# Patient Record
Sex: Female | Born: 1945 | Race: White | Hispanic: No | Smoking: Never smoker
Health system: Southern US, Community
[De-identification: ages and names within clinical notes are randomized; demographics above are authoritative.]

## PROBLEM LIST (undated history)

## (undated) DIAGNOSIS — E039 Hypothyroidism, unspecified: Secondary | ICD-10-CM

## (undated) DIAGNOSIS — F329 Major depressive disorder, single episode, unspecified: Secondary | ICD-10-CM

## (undated) DIAGNOSIS — E785 Hyperlipidemia, unspecified: Secondary | ICD-10-CM

## (undated) DIAGNOSIS — H269 Unspecified cataract: Secondary | ICD-10-CM

## (undated) DIAGNOSIS — I10 Essential (primary) hypertension: Secondary | ICD-10-CM

## (undated) DIAGNOSIS — F32A Depression, unspecified: Secondary | ICD-10-CM

## (undated) DIAGNOSIS — K529 Noninfective gastroenteritis and colitis, unspecified: Secondary | ICD-10-CM

## (undated) DIAGNOSIS — M1990 Unspecified osteoarthritis, unspecified site: Secondary | ICD-10-CM

## (undated) DIAGNOSIS — M810 Age-related osteoporosis without current pathological fracture: Secondary | ICD-10-CM

## (undated) DIAGNOSIS — E063 Autoimmune thyroiditis: Secondary | ICD-10-CM

## (undated) DIAGNOSIS — Z8719 Personal history of other diseases of the digestive system: Secondary | ICD-10-CM

## (undated) DIAGNOSIS — R319 Hematuria, unspecified: Secondary | ICD-10-CM

## (undated) DIAGNOSIS — K219 Gastro-esophageal reflux disease without esophagitis: Secondary | ICD-10-CM

## (undated) DIAGNOSIS — I7300 Raynaud's syndrome without gangrene: Secondary | ICD-10-CM

## (undated) HISTORY — PX: CATARACT EXTRACTION: SUR2

## (undated) HISTORY — DX: Raynaud's syndrome without gangrene: I73.00

## (undated) HISTORY — PX: SALIVARY GLAND SURGERY: SHX768

## (undated) HISTORY — DX: Autoimmune thyroiditis: E06.3

## (undated) HISTORY — DX: Age-related osteoporosis without current pathological fracture: M81.0

## (undated) HISTORY — DX: Major depressive disorder, single episode, unspecified: F32.9

## (undated) HISTORY — DX: Unspecified osteoarthritis, unspecified site: M19.90

## (undated) HISTORY — DX: Essential (primary) hypertension: I10

## (undated) HISTORY — DX: Hyperlipidemia, unspecified: E78.5

## (undated) HISTORY — DX: Noninfective gastroenteritis and colitis, unspecified: K52.9

## (undated) HISTORY — DX: Unspecified cataract: H26.9

## (undated) HISTORY — PX: COLONOSCOPY: SHX174

## (undated) HISTORY — DX: Gastro-esophageal reflux disease without esophagitis: K21.9

## (undated) HISTORY — DX: Hypothyroidism, unspecified: E03.9

## (undated) HISTORY — DX: Hematuria, unspecified: R31.9

## (undated) HISTORY — DX: Depression, unspecified: F32.A

## (undated) HISTORY — DX: Personal history of other diseases of the digestive system: Z87.19

---

## 1974-12-19 HISTORY — PX: APPENDECTOMY: SHX54

## 2013-02-11 ENCOUNTER — Other Ambulatory Visit: Payer: Self-pay | Admitting: Family Medicine

## 2013-02-11 DIAGNOSIS — Z1231 Encounter for screening mammogram for malignant neoplasm of breast: Secondary | ICD-10-CM

## 2013-03-11 ENCOUNTER — Other Ambulatory Visit: Payer: Self-pay

## 2013-03-11 DIAGNOSIS — Z1231 Encounter for screening mammogram for malignant neoplasm of breast: Secondary | ICD-10-CM

## 2013-03-13 ENCOUNTER — Ambulatory Visit
Admission: RE | Admit: 2013-03-13 | Discharge: 2013-03-13 | Disposition: A | Discharge status: Home / Self Care | Payer: Medicare Other | Source: Ambulatory Visit

## 2013-03-13 DIAGNOSIS — Z1231 Encounter for screening mammogram for malignant neoplasm of breast: Secondary | ICD-10-CM

## 2013-10-24 ENCOUNTER — Ambulatory Visit (INDEPENDENT_AMBULATORY_CARE_PROVIDER_SITE_OTHER): Payer: Medicare Other | Admitting: Endocrinology

## 2013-10-24 ENCOUNTER — Encounter: Payer: Self-pay | Admitting: Endocrinology

## 2013-10-24 VITALS — BP 118/64 | HR 69 | Temp 98.6°F | Resp 12 | Ht 64.0 in | Wt 139.6 lb

## 2013-10-24 DIAGNOSIS — E039 Hypothyroidism, unspecified: Secondary | ICD-10-CM

## 2013-10-24 DIAGNOSIS — E7800 Pure hypercholesterolemia, unspecified: Secondary | ICD-10-CM

## 2013-10-24 DIAGNOSIS — E063 Autoimmune thyroiditis: Secondary | ICD-10-CM

## 2013-10-24 NOTE — Progress Notes (Signed)
Patient ID: Elaine Mcintyre, female   DOB: Aug 02, 1946, 67 y.o.   MRN: [id number]  Reason for Appointment:  Hypothyroidism, new visit    History of Present Illness:   The Hyothyroidism was first diagnosed in ? 2009 At the time of diagnosis she had no unusual fatigue, cold sensitivity , difficulty concentrating, dry skin ,difficulty with weight loss or hair loss. Her TSH was checked for routine screening and she does not know what the result was .    She thinks she was started on 50 mcg daily for her elevated TSH initially She did not feel any different with starting the supplement Subsequently she has been taking the same dose and reportedly her TSH levels have been normal subsequently Because of her complaints of insomnia her dose was reduced to 25 mcg in July, however she did not feel any different with this  She requested a position to do a thyroid antibody and since this was positive she was concerned and is here for consultation Her TPO antibody is 241 Also her recent TSH was high at 9.2 with reducing the dose to 25 mcg; also had a TSH of 6.08 none the week before Previously TSH on 03/01/13 was 4.65 and on 07/10/13 was 3.86 Again she is not symptomatic with the high TSH at this time     No results found for any previous visit.  Past Medical History  Diagnosis Date  . Hematuria     No past surgical history on file.  Family History  Problem Relation Age of Onset  . Thyroid disease Neg Hx     Has a positive history of breast cancer and uterine cancer in the family  Social History:  reports that she has never smoked. She has never used smokeless tobacco. Her alcohol and drug histories are not on file.  Allergies:  Allergies  Allergen Reactions  . Effexor [Venlafaxine]   . Paxil [Paroxetine Hcl]     Passed out      Medication List       This list is accurate as of: 10/24/13 11:59 PM.  Always use your most recent med list.               aspirin 81 MG tablet  Take 81  mg by mouth daily.     beta carotene 78295 UNIT capsule  Take 10,000 Units by mouth daily.     clindamycin 1 % lotion  Commonly known as:  CLEOCIN T     estazolam 1 MG tablet  Commonly known as:  PROSOM     fish oil-omega-3 fatty acids 1000 MG capsule  Take 2 g by mouth daily.     fluticasone 50 MCG/ACT nasal spray  Commonly known as:  FLONASE  Place into both nostrils daily.     levothyroxine 25 MCG tablet  Commonly known as:  SYNTHROID, LEVOTHROID     loratadine 10 MG tablet  Commonly known as:  CLARITIN  Take 10 mg by mouth daily.     Melatonin 10 MG Caps  Take by mouth.     vitamin C 1000 MG tablet  Take 1,000 mg by mouth daily.     vitamin E 400 UNIT capsule  Take 400 Units by mouth daily.        Review of Systems:  Has history of allergic rhinitis CARDIOLOGY: no history of high blood pressure.            GASTROENTEROLOGY:  no Change in bowel habits.  ENDOCRINOLOGY:  no history of Diabetes.   She does have mild joint pains but mostly in her thumbs She did have a positive ANA at one time She has history of insomnia         Examination:    BP 118/64  Pulse 69  Temp(Src) 98.6 F (37 C)  Resp 12  Ht 5\' 4"  (1.626 m)  Wt 139 lb 9.6 oz (63.322 kg)  BMI 23.95 kg/m2  SpO2 99%   General Appearance: pleasant, averagely built and nourished          Eyes: No proptosis or eyelid swelling.          Neck: The thyroid is not palpable  There is no lymphadenopathy .    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 Reassured her that having a positive antibody has no implication  on the type of treatment she needs to take but is only a marker of Hashimoto thyroiditis that she has She may be having mild increase in her hypercholesterolemia when she is not adequately replaced with thyroxine   Treatment:  She should go back to 50 mcg of levothyroxine She will followup As long as she can have her TSH within the normal range she can continue the same dose    Lavergne Hiltunen 10/25/2013, 9:28 AM

## 2013-10-24 NOTE — Patient Instructions (Signed)
Increase dose to 50ug daily

## 2013-10-25 ENCOUNTER — Encounter: Payer: Self-pay | Admitting: Endocrinology

## 2013-10-25 DIAGNOSIS — E7800 Pure hypercholesterolemia, unspecified: Secondary | ICD-10-CM | POA: Insufficient documentation

## 2013-10-25 DIAGNOSIS — E039 Hypothyroidism, unspecified: Secondary | ICD-10-CM | POA: Insufficient documentation

## 2014-01-14 ENCOUNTER — Other Ambulatory Visit (HOSPITAL_COMMUNITY)
Admission: RE | Admit: 2014-01-14 | Discharge: 2014-01-14 | Disposition: A | Discharge status: Home / Self Care | Payer: Medicare Other | Source: Ambulatory Visit | Attending: Family Medicine | Admitting: Family Medicine

## 2014-01-14 ENCOUNTER — Other Ambulatory Visit: Payer: Self-pay | Admitting: Family Medicine

## 2014-01-14 DIAGNOSIS — Z0000 Encounter for general adult medical examination without abnormal findings: Secondary | ICD-10-CM | POA: Insufficient documentation

## 2014-01-14 DIAGNOSIS — N644 Mastodynia: Secondary | ICD-10-CM

## 2014-01-14 DIAGNOSIS — N63 Unspecified lump in unspecified breast: Secondary | ICD-10-CM

## 2014-01-22 ENCOUNTER — Other Ambulatory Visit: Payer: Self-pay | Admitting: Family Medicine

## 2014-01-22 DIAGNOSIS — M8580 Other specified disorders of bone density and structure, unspecified site: Secondary | ICD-10-CM

## 2014-01-30 ENCOUNTER — Ambulatory Visit
Admission: RE | Admit: 2014-01-30 | Discharge: 2014-01-30 | Disposition: A | Discharge status: Home / Self Care | Payer: PRIVATE HEALTH INSURANCE | Source: Ambulatory Visit | Attending: Family Medicine | Admitting: Family Medicine

## 2014-01-30 ENCOUNTER — Ambulatory Visit
Admission: RE | Admit: 2014-01-30 | Discharge: 2014-01-30 | Disposition: A | Discharge status: Home / Self Care | Payer: 59 | Source: Ambulatory Visit | Attending: Family Medicine | Admitting: Family Medicine

## 2014-01-30 ENCOUNTER — Ambulatory Visit
Admission: RE | Admit: 2014-01-30 | Discharge: 2014-01-30 | Disposition: A | Discharge status: Home / Self Care | Payer: MEDICARE | Source: Ambulatory Visit | Attending: Family Medicine | Admitting: Family Medicine

## 2014-01-30 DIAGNOSIS — N644 Mastodynia: Secondary | ICD-10-CM

## 2014-01-30 DIAGNOSIS — N63 Unspecified lump in unspecified breast: Secondary | ICD-10-CM

## 2014-02-06 ENCOUNTER — Ambulatory Visit
Admission: RE | Admit: 2014-02-06 | Discharge: 2014-02-06 | Disposition: A | Discharge status: Home / Self Care | Payer: 59 | Source: Ambulatory Visit | Attending: Family Medicine | Admitting: Family Medicine

## 2014-02-06 DIAGNOSIS — M8580 Other specified disorders of bone density and structure, unspecified site: Secondary | ICD-10-CM

## 2015-01-12 ENCOUNTER — Other Ambulatory Visit: Payer: Self-pay | Admitting: Dermatology

## 2015-01-22 ENCOUNTER — Other Ambulatory Visit: Payer: Self-pay | Admitting: Family Medicine

## 2015-01-22 DIAGNOSIS — N644 Mastodynia: Secondary | ICD-10-CM

## 2015-01-22 DIAGNOSIS — M8580 Other specified disorders of bone density and structure, unspecified site: Secondary | ICD-10-CM

## 2015-02-02 ENCOUNTER — Inpatient Hospital Stay: Admission: RE | Admit: 2015-02-02 | Payer: Self-pay | Source: Ambulatory Visit

## 2015-02-02 ENCOUNTER — Other Ambulatory Visit: Payer: Self-pay

## 2015-02-03 ENCOUNTER — Other Ambulatory Visit: Payer: Self-pay

## 2015-02-04 ENCOUNTER — Other Ambulatory Visit: Payer: Self-pay | Admitting: Family Medicine

## 2015-02-04 ENCOUNTER — Ambulatory Visit
Admission: RE | Admit: 2015-02-04 | Discharge: 2015-02-04 | Disposition: A | Discharge status: Home / Self Care | Payer: Medicare Other | Source: Ambulatory Visit | Attending: Family Medicine | Admitting: Family Medicine

## 2015-02-04 DIAGNOSIS — N644 Mastodynia: Secondary | ICD-10-CM

## 2015-09-04 ENCOUNTER — Encounter: Payer: Self-pay | Admitting: Gastroenterology

## 2015-10-26 ENCOUNTER — Ambulatory Visit: Payer: Medicare Other | Admitting: Gastroenterology

## 2015-12-01 ENCOUNTER — Encounter: Payer: Self-pay | Admitting: Gastroenterology

## 2015-12-01 ENCOUNTER — Ambulatory Visit (INDEPENDENT_AMBULATORY_CARE_PROVIDER_SITE_OTHER): Payer: Medicare Other | Admitting: Gastroenterology

## 2015-12-01 VITALS — BP 108/74 | HR 76 | Ht 63.5 in | Wt 140.1 lb

## 2015-12-01 DIAGNOSIS — R1310 Dysphagia, unspecified: Secondary | ICD-10-CM

## 2015-12-01 DIAGNOSIS — Z800 Family history of malignant neoplasm of digestive organs: Secondary | ICD-10-CM | POA: Diagnosis not present

## 2015-12-01 MED ORDER — NA SULFATE-K SULFATE-MG SULF 17.5-3.13-1.6 GM/177ML PO SOLN: 1.0000 | Freq: Once | ORAL | Status: DC

## 2015-12-01 NOTE — Patient Instructions (Addendum)
We will get records sent from your previous gastroenterologist ([place]) for review.  This will include any endoscopic (colonoscopy or upper endoscopy) procedures and any associated pathology reports.   You will be set up for a colonoscopy for FH of colon cancer. You will be set up for an upper endoscopy for dysphagia. Start ranitidine 150mg  pill, at bedtime every night.

## 2015-12-01 NOTE — Progress Notes (Signed)
HPI: This is a   very pleasant 69 year old woman    who was referred to me by Antony Blackbird, MD  to evaluate  GERD symptoms, family history colon cancer .    Chief complaint is GERD, dysphasia, family history of colon cancer  From [place], 3 1/2 years ago.  Retired Automotive engineer.  Recent allergist visit for coughing.  All testing was negative.  HE suggested that her HH was the cause of her cough.  Recent CT scan from urologist suggested [facility].  Occasional reflux into her mouth, acid tasting, will swallow it.  Overall her weight has been stable.  Pyrosis occasionally.  Feels subtle pyrosis at night while laying down.  Often eats about 2 hours before laying down.  Does not take anything for GERD.  2 cups coffee in AM.  Non smoker.  Non drinker.  Her father had colon cancer; died from it.  Her last colonoscopy was June 02, 2011  [facility] in [place]); she believes a polyp was removed.  Intermittent dysphagia. She will slow down.  Review of systems: Pertinent positive and negative review of systems were noted in the above HPI section. Complete review of systems was performed and was otherwise normal.   Past Medical History  Diagnosis Date  . Hematuria   . Hypothyroidism   . Hashimoto's disease   . HLD (hyperlipidemia)   . HTN (hypertension)   . History of IBS   . Colitis     Past Surgical History  Procedure Laterality Date  . Appendectomy  1976  . Cholecystectomy  1976  . Salivary gland surgery      and lymph gland    Current Outpatient Prescriptions  Medication Sig Dispense Refill  . Cholecalciferol (VITAMIN D3) 1000 UNITS CAPS Take 1 capsule by mouth daily.    Marland Kitchen estazolam (PROSOM) 1 MG tablet Take 1 mg by mouth at bedtime.     . fish oil-omega-3 fatty acids 1000 MG capsule Take 2 g by mouth daily.    Marland Kitchen levothyroxine (SYNTHROID, LEVOTHROID) 50 MCG tablet Take 50 mcg by mouth daily before breakfast.    . Melatonin 10 MG CAPS Take 2 tablets  by mouth at bedtime as needed.      No current facility-administered medications for this visit.    Allergies as of 12/01/2015 - Review Complete 12/01/2015  Allergen Reaction Noted  . Effexor [venlafaxine]  10/24/2013  . Paxil [paroxetine hcl]  10/24/2013    Family History  Problem Relation Age of Onset  . Thyroid disease Neg Hx   . Colon cancer Father   . Breast cancer Mother   . Uterine cancer Mother   . Pancreatic cancer Maternal Uncle   . Diabetes Mother   . Heart disease Mother   . Heart disease Maternal Grandfather     Social History   Social History  . Marital Status: Divorced    Spouse Name: N/A  . Number of Children: 1  . Years of Education: N/A   Occupational History  . retired    Social History Main Topics  . Smoking status: Never Smoker   . Smokeless tobacco: Never Used  . Alcohol Use: 0.0 oz/week    0 Standard drinks or equivalent per week     Comment: glass of wine every 4 months  . Drug Use: No  . Sexual Activity: Not on file   Other Topics Concern  . Not on file   Social History Narrative     Physical Exam:  Ht 5' 3.5" (1.613 m)  Wt 140 lb 2 oz (63.56 kg)  BMI 24.43 kg/m2 Constitutional: generally well-appearing Psychiatric: alert and oriented x3 Eyes: extraocular movements intact Mouth: oral pharynx moist, no lesions Neck: supple no lymphadenopathy Cardiovascular: heart regular rate and rhythm Lungs: clear to auscultation bilaterally Abdomen: soft, nontender, nondistended, no obvious ascites, no peritoneal signs, normal bowel sounds Extremities: no lower extremity edema bilaterally Skin: no lesions on visible extremities   Assessment and plan: 69 y.o. female with  fairly classic GERD symptoms with pyrosis especially at night, intermittent dysphasia, family history of colon cancer  I recommended that she begin nighttime H2 blocker on a scheduled basis. She will also try to not eat within 2-3 hours of laying down for bed. She does  have alarm symptom of intermittent dysphasia and I recommend we proceed with EGD at her soonest convenience. At the same time she will undergo colonoscopy for family history of colon cancer in her father. Her last screening examination was about 4/2 years ago.   Owens Loffler, MD [facility] 12/01/2015, 9:27 AM  Cc: Antony Blackbird, MD

## 2015-12-28 ENCOUNTER — Ambulatory Visit: Payer: Medicare Other | Admitting: Gastroenterology

## 2016-02-01 ENCOUNTER — Encounter: Payer: Medicare Other | Admitting: Gastroenterology

## 2016-02-04 ENCOUNTER — Telehealth: Payer: Self-pay | Admitting: Gastroenterology

## 2016-02-04 NOTE — Telephone Encounter (Signed)
Disregard. Records came through to our fax just now. I will give records to [facility].

## 2016-02-05 ENCOUNTER — Telehealth: Payer: Self-pay | Admitting: Gastroenterology

## 2016-02-05 NOTE — Telephone Encounter (Signed)
Rec'd from [facility] forward 7 pages to [employer]

## 2016-02-08 ENCOUNTER — Telehealth: Payer: Self-pay | Admitting: Gastroenterology

## 2016-02-08 ENCOUNTER — Encounter: Payer: Self-pay | Admitting: Gastroenterology

## 2016-02-08 ENCOUNTER — Ambulatory Visit (AMBULATORY_SURGERY_CENTER): Payer: Medicare Other | Admitting: Gastroenterology

## 2016-02-08 VITALS — BP 109/63 | HR 64 | Temp 97.0°F | Resp 12 | Ht 63.5 in | Wt 140.0 lb

## 2016-02-08 DIAGNOSIS — D122 Benign neoplasm of ascending colon: Secondary | ICD-10-CM

## 2016-02-08 DIAGNOSIS — R1310 Dysphagia, unspecified: Secondary | ICD-10-CM

## 2016-02-08 DIAGNOSIS — Z1211 Encounter for screening for malignant neoplasm of colon: Secondary | ICD-10-CM

## 2016-02-08 DIAGNOSIS — Z800 Family history of malignant neoplasm of digestive organs: Secondary | ICD-10-CM | POA: Diagnosis not present

## 2016-02-08 MED ORDER — SODIUM CHLORIDE 0.9 % IV SOLN: 500.0000 mL | INTRAVENOUS | Status: DC

## 2016-02-08 NOTE — Progress Notes (Signed)
Report to PACU, RN, vss, BBS= Clear.  

## 2016-02-08 NOTE — Op Note (Signed)
[place]  [address] [place], [id number]   COLONOSCOPY PROCEDURE REPORT  PATIENT: Elaine Mcintyre, Elaine Mcintyre  MR#: [id number] BIRTHDATE: 1946-12-07 , 69  yrs. old GENDER: female ENDOSCOPIST: Milus Banister, MD REFERRED [id number] [place] PROCEDURE DATE:  02/08/2016 PROCEDURE:   Colonoscopy, surveillance and Colonoscopy with snare polypectomy First Screening Colonoscopy - Avg.  risk and is 50 yrs.  old or older - No.  Prior Negative Screening - Now for repeat screening. N/A  History of Adenoma - Now for follow-up colonoscopy & has been > or = to 3 yrs.  Yes hx of adenoma.  Has been 3 or more years since last colonoscopy.  Polyps removed today? Yes ASA CLASS:   Class II INDICATIONS:Surveillance due to prior colonic neoplasia and Colonoscopy in [place] 2012; single subCM adenoma; also FH of colon cancer (father in his 15s). MEDICATIONS: Monitored anesthesia care and Propofol 200 mg IV  DESCRIPTION OF PROCEDURE:   After the risks benefits and alternatives of the procedure were thoroughly explained, informed consent was obtained.  The digital rectal exam revealed no abnormalities of the rectum.   The LB [id number]  endoscope was introduced through the anus and advanced to the cecum, which was identified by both the appendix and ileocecal valve. No adverse events experienced.   The quality of the prep was excellent.  The instrument was then slowly withdrawn as the colon was fully examined. Estimated blood loss is zero unless otherwise noted in this procedure report.  COLON FINDINGS: A sessile polyp measuring 4 mm in size was found in the ascending colon.  A polypectomy was performed with a cold snare.  The resection was complete, the polyp tissue was completely retrieved and sent to histology.   There was mild diverticulosis noted in the left colon.   The examination was otherwise normal. Retroflexed views revealed no abnormalities. The time to cecum = 5.4 Withdrawal time  = 7.7   The scope was withdrawn and the procedure completed. COMPLICATIONS: There were no immediate complications.  ENDOSCOPIC IMPRESSION: 1.   Sessile polyp was found in the ascending colon; polypectomy was performed with a cold snare 2.   Mild diverticulosis was noted in the left colon 3.   The examination was otherwise normal  RECOMMENDATIONS: Given your significant family history of colon cancer (father), you should have a repeat colonoscopy in 5 years even if the polyp removed today is NOT precancerous.  You will receive a letter within 1-2 weeks with the results of your biopsy as well as final recommendations.  Please call my office if you have not received a letter after 3 weeks.  eSigned:  Milus Banister, MD 02/08/2016 10:57 AM

## 2016-02-08 NOTE — Op Note (Signed)
[place]  [address]. [place], [id number]   ENDOSCOPY PROCEDURE REPORT  PATIENT: Elaine Mcintyre, Elaine Mcintyre  MR#: [id number] BIRTHDATE: Apr 12, 1946 , 69  yrs. old GENDER: female ENDOSCOPIST: Milus Banister, MD PROCEDURE DATE:  02/08/2016 PROCEDURE:  EGD, diagnostic ASA CLASS:     Class II INDICATIONS:  GERD. MEDICATIONS: Monitored anesthesia care and Propofol 30 mg IV TOPICAL ANESTHETIC: none  DESCRIPTION OF PROCEDURE: After the risks benefits and alternatives of the procedure were thoroughly explained, informed consent was obtained.  The LB [id number] endoscope was introduced through the mouth and advanced to the second portion of the duodenum , Without limitations.  The instrument was slowly withdrawn as the mucosa was fully examined.  There was a 4cm hiatal hernia without obvious Cameron's type erosions.  The examination was otherwise normal.  Retroflexed views revealed no abnormalities.     The scope was then withdrawn from the patient and the procedure completed. COMPLICATIONS: There were no immediate complications.  ENDOSCOPIC IMPRESSION: There was a 4cm hiatal hernia without obvious Cameron's type erosions.  The examination was otherwise normal  RECOMMENDATIONS: Continue once nightly ranitidine.   eSigned:  Milus Banister, MD 02/08/2016 11:00 AM    CC: Antony Blackbird, MD

## 2016-02-08 NOTE — Telephone Encounter (Signed)
Pt was asking what the difference in types of polyps mean.  We discussed at length tubular and hyperplastic polyps.  She was satisfied with the answers and will call back with any further concerns.

## 2016-02-08 NOTE — Telephone Encounter (Signed)
Colonoscopy June 02, 2011 [facility] in [place]); done for [place] of colon cancer; 52mm polyp in cecum removed, was a TA on pathology.  Also diverticulum in left colon. Colonoscopy 2007 [facility] [place]); done for FH of colon cancer; diverticulosis left colon

## 2016-02-08 NOTE — Progress Notes (Signed)
Called to room to assist during endoscopic procedure.  Patient ID and intended procedure confirmed with present staff. Received instructions for my participation in the procedure from the performing physician.  

## 2016-02-08 NOTE — Patient Instructions (Signed)

## 2016-02-09 ENCOUNTER — Telehealth: Payer: Self-pay | Admitting: Emergency Medicine

## 2016-02-09 NOTE — Telephone Encounter (Signed)
Left message, identifier present per f/u  

## 2016-02-12 ENCOUNTER — Other Ambulatory Visit: Payer: Self-pay | Admitting: Family

## 2016-02-12 ENCOUNTER — Other Ambulatory Visit: Payer: Self-pay | Admitting: Family Medicine

## 2016-02-12 DIAGNOSIS — N644 Mastodynia: Secondary | ICD-10-CM

## 2016-02-12 DIAGNOSIS — E2839 Other primary ovarian failure: Secondary | ICD-10-CM

## 2016-02-15 ENCOUNTER — Encounter: Payer: Self-pay | Admitting: Gastroenterology

## 2016-02-22 ENCOUNTER — Telehealth: Payer: Self-pay | Admitting: Gastroenterology

## 2016-02-22 NOTE — Telephone Encounter (Signed)
All pt questions have been answered and she will call back if she has further concerns

## 2016-02-22 NOTE — Telephone Encounter (Signed)
patient states that she has questions regarding the polyp that was removed. she wants to know if the polyp was pre cancerous? she is wanting to know what sessiele(sp?) polyp? she is also wanting to discuss EGD results. She is wanting to know if she has barrett's esophagus?

## 2016-02-23 ENCOUNTER — Other Ambulatory Visit: Payer: Medicare Other

## 2016-02-23 ENCOUNTER — Inpatient Hospital Stay: Admission: RE | Admit: 2016-02-23 | Payer: Medicare Other | Source: Ambulatory Visit

## 2016-03-01 ENCOUNTER — Ambulatory Visit
Admission: RE | Admit: 2016-03-01 | Discharge: 2016-03-01 | Disposition: A | Discharge status: Home / Self Care | Payer: Medicare Other | Source: Ambulatory Visit | Attending: Family | Admitting: Family

## 2016-03-01 ENCOUNTER — Ambulatory Visit
Admission: RE | Admit: 2016-03-01 | Discharge: 2016-03-01 | Disposition: A | Discharge status: Home / Self Care | Payer: Medicare Other | Source: Ambulatory Visit | Attending: Family Medicine | Admitting: Family Medicine

## 2016-03-01 DIAGNOSIS — N644 Mastodynia: Secondary | ICD-10-CM

## 2016-03-01 DIAGNOSIS — E2839 Other primary ovarian failure: Secondary | ICD-10-CM

## 2016-03-03 ENCOUNTER — Other Ambulatory Visit: Payer: Medicare Other

## 2017-02-20 ENCOUNTER — Other Ambulatory Visit: Payer: Self-pay | Admitting: Family

## 2017-02-20 DIAGNOSIS — Z1231 Encounter for screening mammogram for malignant neoplasm of breast: Secondary | ICD-10-CM

## 2017-03-16 ENCOUNTER — Ambulatory Visit
Admission: RE | Admit: 2017-03-16 | Discharge: 2017-03-16 | Disposition: A | Discharge status: Home / Self Care | Payer: Medicare Other | Source: Ambulatory Visit | Attending: Family | Admitting: Family

## 2017-03-16 DIAGNOSIS — Z1231 Encounter for screening mammogram for malignant neoplasm of breast: Secondary | ICD-10-CM

## 2018-02-22 ENCOUNTER — Other Ambulatory Visit: Payer: Self-pay | Admitting: Nurse Practitioner

## 2018-02-26 ENCOUNTER — Other Ambulatory Visit: Payer: Self-pay | Admitting: Nurse Practitioner

## 2018-02-26 DIAGNOSIS — Z1231 Encounter for screening mammogram for malignant neoplasm of breast: Secondary | ICD-10-CM

## 2018-02-26 DIAGNOSIS — M810 Age-related osteoporosis without current pathological fracture: Secondary | ICD-10-CM

## 2018-03-20 ENCOUNTER — Ambulatory Visit: Payer: Medicare Other

## 2018-03-20 ENCOUNTER — Other Ambulatory Visit: Payer: Medicare Other

## 2018-03-27 ENCOUNTER — Ambulatory Visit
Admission: RE | Admit: 2018-03-27 | Discharge: 2018-03-27 | Disposition: A | Discharge status: Home / Self Care | Payer: Medicare Other | Source: Ambulatory Visit | Attending: Nurse Practitioner | Admitting: Nurse Practitioner

## 2018-03-27 DIAGNOSIS — Z1231 Encounter for screening mammogram for malignant neoplasm of breast: Secondary | ICD-10-CM

## 2018-03-28 ENCOUNTER — Other Ambulatory Visit: Payer: Self-pay | Admitting: Nurse Practitioner

## 2018-03-28 DIAGNOSIS — R928 Other abnormal and inconclusive findings on diagnostic imaging of breast: Secondary | ICD-10-CM

## 2018-04-02 ENCOUNTER — Ambulatory Visit
Admission: RE | Admit: 2018-04-02 | Discharge: 2018-04-02 | Disposition: A | Discharge status: Home / Self Care | Payer: Medicare Other | Source: Ambulatory Visit | Attending: Nurse Practitioner | Admitting: Nurse Practitioner

## 2018-04-02 ENCOUNTER — Other Ambulatory Visit: Payer: Self-pay | Admitting: Internal Medicine

## 2018-04-02 DIAGNOSIS — R928 Other abnormal and inconclusive findings on diagnostic imaging of breast: Secondary | ICD-10-CM

## 2018-04-20 ENCOUNTER — Ambulatory Visit
Admission: RE | Admit: 2018-04-20 | Discharge: 2018-04-20 | Disposition: A | Discharge status: Home / Self Care | Payer: Medicare Other | Source: Ambulatory Visit | Attending: Nurse Practitioner | Admitting: Nurse Practitioner

## 2018-04-20 DIAGNOSIS — M810 Age-related osteoporosis without current pathological fracture: Secondary | ICD-10-CM

## 2018-11-09 ENCOUNTER — Telehealth: Payer: Self-pay | Admitting: Gastroenterology

## 2018-11-09 NOTE — Telephone Encounter (Signed)
Patient states there was a recall on medication ranitidine and she was wanting to know what Dr.Jacobs suggest she take instead. Patient has not been seen since 2017.

## 2018-11-09 NOTE — Telephone Encounter (Signed)
The pt has been advised to switch to pepcid per Dr Ardis Hughs recommendations.  Pt also needs to set up follow up appt.  Pt to call and set up

## 2019-02-26 ENCOUNTER — Other Ambulatory Visit: Payer: Self-pay | Admitting: Nurse Practitioner

## 2019-02-26 DIAGNOSIS — Z1231 Encounter for screening mammogram for malignant neoplasm of breast: Secondary | ICD-10-CM

## 2019-03-29 ENCOUNTER — Ambulatory Visit: Payer: Medicare Other

## 2019-07-11 ENCOUNTER — Ambulatory Visit
Admission: RE | Admit: 2019-07-11 | Discharge: 2019-07-11 | Disposition: A | Discharge status: Home / Self Care | Payer: Medicare Other | Source: Ambulatory Visit | Attending: Internal Medicine | Admitting: Internal Medicine

## 2019-07-11 ENCOUNTER — Other Ambulatory Visit: Payer: Self-pay | Admitting: Nurse Practitioner

## 2019-07-11 ENCOUNTER — Other Ambulatory Visit: Payer: Self-pay | Admitting: Internal Medicine

## 2019-07-11 ENCOUNTER — Ambulatory Visit: Payer: Medicare Other

## 2019-07-11 ENCOUNTER — Other Ambulatory Visit: Payer: Self-pay

## 2019-07-11 DIAGNOSIS — N644 Mastodynia: Secondary | ICD-10-CM

## 2019-09-03 ENCOUNTER — Ambulatory Visit: Payer: Medicare Other

## 2020-02-27 ENCOUNTER — Other Ambulatory Visit: Payer: Self-pay | Admitting: Internal Medicine

## 2020-02-27 DIAGNOSIS — M810 Age-related osteoporosis without current pathological fracture: Secondary | ICD-10-CM

## 2020-02-28 ENCOUNTER — Other Ambulatory Visit: Payer: Self-pay | Admitting: Internal Medicine

## 2020-02-28 DIAGNOSIS — Z1231 Encounter for screening mammogram for malignant neoplasm of breast: Secondary | ICD-10-CM

## 2020-05-01 ENCOUNTER — Other Ambulatory Visit: Payer: Self-pay

## 2020-05-01 ENCOUNTER — Ambulatory Visit
Admission: RE | Admit: 2020-05-01 | Discharge: 2020-05-01 | Disposition: A | Discharge status: Home / Self Care | Payer: Medicare Other | Source: Ambulatory Visit | Attending: Internal Medicine | Admitting: Internal Medicine

## 2020-05-01 DIAGNOSIS — M810 Age-related osteoporosis without current pathological fracture: Secondary | ICD-10-CM

## 2020-05-15 ENCOUNTER — Other Ambulatory Visit: Payer: Medicare Other

## 2020-06-03 ENCOUNTER — Other Ambulatory Visit: Payer: Self-pay | Admitting: Internal Medicine

## 2020-06-03 ENCOUNTER — Ambulatory Visit
Admission: RE | Admit: 2020-06-03 | Discharge: 2020-06-03 | Disposition: A | Discharge status: Home / Self Care | Payer: Medicare Other | Source: Ambulatory Visit | Attending: Internal Medicine | Admitting: Internal Medicine

## 2020-06-03 DIAGNOSIS — M25561 Pain in right knee: Secondary | ICD-10-CM

## 2020-07-13 ENCOUNTER — Ambulatory Visit
Admission: RE | Admit: 2020-07-13 | Discharge: 2020-07-13 | Disposition: A | Discharge status: Home / Self Care | Payer: Medicare Other | Source: Ambulatory Visit | Attending: Internal Medicine | Admitting: Internal Medicine

## 2020-07-13 ENCOUNTER — Other Ambulatory Visit: Payer: Self-pay

## 2020-07-13 DIAGNOSIS — Z1231 Encounter for screening mammogram for malignant neoplasm of breast: Secondary | ICD-10-CM

## 2021-04-07 ENCOUNTER — Other Ambulatory Visit: Payer: Self-pay | Admitting: Internal Medicine

## 2021-04-07 DIAGNOSIS — Z1231 Encounter for screening mammogram for malignant neoplasm of breast: Secondary | ICD-10-CM

## 2021-04-20 ENCOUNTER — Encounter: Payer: Self-pay | Admitting: Gastroenterology

## 2021-06-16 ENCOUNTER — Ambulatory Visit (AMBULATORY_SURGERY_CENTER): Payer: Medicare Other | Admitting: *Deleted

## 2021-06-16 ENCOUNTER — Other Ambulatory Visit: Payer: Self-pay

## 2021-06-16 VITALS — Ht 64.0 in | Wt 140.0 lb

## 2021-06-16 DIAGNOSIS — Z800 Family history of malignant neoplasm of digestive organs: Secondary | ICD-10-CM

## 2021-06-16 DIAGNOSIS — Z86010 Personal history of colonic polyps: Secondary | ICD-10-CM

## 2021-06-16 MED ORDER — NA SULFATE-K SULFATE-MG SULF 17.5-3.13-1.6 GM/177ML PO SOLN: 1.0000 | Freq: Once | ORAL | 0 refills | Status: AC

## 2021-06-16 NOTE — Progress Notes (Signed)
Virtual pre visit completed. Instructions sent through secure email [email or web address]    No egg or soy allergy known to patient  No issues with past sedation with any surgeries or procedures Patient denies ever being told they had issues or difficulty with intubation  No FH of Malignant Hyperthermia No diet pills per patient No home 02 use per patient  No blood thinners per patient  Pt denies issues with constipation  No A fib or A flutter  EMMI video to pt or via Gregory 19 guidelines implemented in [place] today with Pt and RN  Pt is fully vaccinated  for Covid    Discussed with pt there will be an out-of-pocket cost for prep and that varies from $0 to 70 dollars   Due to the COVID-19 pandemic we are asking patients to follow certain guidelines.  Pt aware of COVID protocols and [facility] guidelines

## 2021-06-29 ENCOUNTER — Encounter: Payer: Medicare Other | Admitting: Gastroenterology

## 2021-07-09 ENCOUNTER — Other Ambulatory Visit: Payer: Self-pay

## 2021-07-09 ENCOUNTER — Encounter: Payer: Self-pay | Admitting: Gastroenterology

## 2021-07-09 ENCOUNTER — Ambulatory Visit (AMBULATORY_SURGERY_CENTER): Payer: Medicare Other | Admitting: Gastroenterology

## 2021-07-09 VITALS — BP 112/56 | HR 64 | Temp 98.0°F | Resp 20 | Ht 64.0 in | Wt 140.0 lb

## 2021-07-09 DIAGNOSIS — Z86010 Personal history of colonic polyps: Secondary | ICD-10-CM | POA: Diagnosis not present

## 2021-07-09 DIAGNOSIS — Z800 Family history of malignant neoplasm of digestive organs: Secondary | ICD-10-CM

## 2021-07-09 DIAGNOSIS — D123 Benign neoplasm of transverse colon: Secondary | ICD-10-CM

## 2021-07-09 DIAGNOSIS — D125 Benign neoplasm of sigmoid colon: Secondary | ICD-10-CM | POA: Diagnosis not present

## 2021-07-09 MED ORDER — SODIUM CHLORIDE 0.9 % IV SOLN: 500.0000 mL | INTRAVENOUS | Status: DC

## 2021-07-09 NOTE — Patient Instructions (Signed)
YOU HAD AN ENDOSCOPIC PROCEDURE TODAY AT THE [facility]:   Refer to the procedure report that was given to you for any specific questions about what was found during the examination.  If the procedure report does not answer your questions, please call your gastroenterologist to clarify.  If you requested that your care partner not be given the details of your procedure findings, then the procedure report has been included in a sealed envelope for you to review at your convenience later.  YOU SHOULD EXPECT: Some feelings of bloating in the abdomen. Passage of more gas than usual.  Walking can help get rid of the air that was put into your GI tract during the procedure and reduce the bloating. If you had a lower endoscopy (such as a colonoscopy or flexible sigmoidoscopy) you may notice spotting of blood in your stool or on the toilet paper. If you underwent a bowel prep for your procedure, you may not have a normal bowel movement for a few days.  Please Note:  You might notice some irritation and congestion in your nose or some drainage.  This is from the oxygen used during your procedure.  There is no need for concern and it should clear up in a day or so.  SYMPTOMS TO REPORT IMMEDIATELY:   Following lower endoscopy (colonoscopy or flexible sigmoidoscopy):  Excessive amounts of blood in the stool  Significant tenderness or worsening of abdominal pains  Swelling of the abdomen that is new, acute  Fever of 100F or higher   Following upper endoscopy (EGD)  Vomiting of blood or coffee ground material  New chest pain or pain under the shoulder blades  Painful or persistently difficult swallowing  New shortness of breath  Fever of 100F or higher  Black, tarry-looking stools  For urgent or emergent issues, a gastroenterologist can be reached at any hour by calling [phone]. Do not use MyChart messaging for urgent concerns.    DIET:  We do recommend a small meal at first, but  then you may proceed to your regular diet.  Drink plenty of fluids but you should avoid alcoholic beverages for 24 hours.  ACTIVITY:  You should plan to take it easy for the rest of today and you should NOT DRIVE or use heavy machinery until tomorrow (because of the sedation medicines used during the test).    FOLLOW UP: Our staff will call the number listed on your records 48-72 hours following your procedure to check on you and address any questions or concerns that you may have regarding the information given to you following your procedure. If we do not reach you, we will leave a message.  We will attempt to reach you two times.  During this call, we will ask if you have developed any symptoms of COVID 19. If you develop any symptoms (ie: fever, flu-like symptoms, shortness of breath, cough etc.) before then, please call [phone].  If you test positive for Covid 19 in the 2 weeks post procedure, please call and report this information to us.    If any biopsies were taken you will be contacted by phone or by letter within the next 1-3 weeks.  Please call us at [phone] if you have not heard about the biopsies in 3 weeks.    SIGNATURES/CONFIDENTIALITY: You and/or your care partner have signed paperwork which will be entered into your electronic medical record.  These signatures attest to the fact that that the information above on   your After Visit Summary has been reviewed and is understood.  Full responsibility of the confidentiality of this discharge information lies with you and/or your care-partner. 

## 2021-07-09 NOTE — Progress Notes (Signed)
pt tolerated well. VSS. awake and to recovery. Report given to RN.  

## 2021-07-09 NOTE — Op Note (Signed)
[facility] Patient Name: Elaine Mcintyre Procedure Date: 07/09/2021 11:03 AM MRN: [id number] Endoscopist: Milus Banister , MD Age: 75 Referring MD:  Date of Birth: [birth year] Gender: Female Account #: [id number] Procedure:                Colonoscopy Indications:              High risk colon cancer surveillance: Personal                            history of colonic polyps, also father had colon                            cancer in his 65s: Colonoscopy in [place] 2012 single                            subCM adenoma; Colonoscopy 2017 single subCM                            adenoma removed Medicines:                Monitored Anesthesia Care Procedure:                Pre-Anesthesia Assessment:                           - Prior to the procedure, a History and Physical                            was performed, and patient medications and                            allergies were reviewed. The patient's tolerance of                            previous anesthesia was also reviewed. The risks                            and benefits of the procedure and the sedation                            options and risks were discussed with the patient.                            All questions were answered, and informed consent                            was obtained. Prior Anticoagulants: The patient has                            taken no previous anticoagulant or antiplatelet                            agents. ASA Grade Assessment: II - A patient with  mild systemic disease. After reviewing the risks                            and benefits, the patient was deemed in                            satisfactory condition to undergo the procedure.                           After obtaining informed consent, the colonoscope                            was passed under direct vision. Throughout the                            procedure, the patient's blood pressure, pulse, and                             oxygen saturations were monitored continuously. The                            Olympus CF-HQ190L was introduced through the anus                            and advanced to the the cecum, identified by                            appendiceal orifice and ileocecal valve. The                            colonoscopy was performed without difficulty. The                            patient tolerated the procedure well. The quality                            of the bowel preparation was good. The ileocecal                            valve, appendiceal orifice, and rectum were                            photographed. Scope In: 11:08:34 AM Scope Out: 11:22:32 AM Scope Withdrawal Time: 0 hours 8 minutes 18 seconds  Total Procedure Duration: 0 hours 13 minutes 58 seconds  Findings:                 Two sessile polyps were found in the sigmoid colon                            and transverse colon. The polyps were 2 to 4 mm in                            size. These polyps were removed with a cold  snare.                            Resection and retrieval were complete.                           Multiple small and large-mouthed diverticula were                            found in the left colon.                           The exam was otherwise without abnormality on                            direct and retroflexion views. Complications:            No immediate complications. Estimated blood loss:                            None. Estimated Blood Loss:     Estimated blood loss: none. Impression:               - Two 2 to 4 mm polyps in the sigmoid colon and in                            the transverse colon, removed with a cold snare.                            Resected and retrieved.                           - Diverticulosis in the left colon.                           - The examination was otherwise normal on direct                            and retroflexion views. Recommendation:            - Patient has a contact number available for                            emergencies. The signs and symptoms of potential                            delayed complications were discussed with the                            patient. Return to normal activities tomorrow.                            Written discharge instructions were provided to the                            patient.                           -  Resume previous diet.                           - Continue present medications.                           - Await pathology results. Milus Banister, MD 07/09/2021 11:27:44 AM This report has been signed electronically.

## 2021-07-09 NOTE — Progress Notes (Signed)
Vs CW I have reviewed the patient's medical history in detail and updated the computerized patient record.   

## 2021-07-09 NOTE — Progress Notes (Signed)
Called to room to assist during endoscopic procedure.  Patient ID and intended procedure confirmed with present staff. Received instructions for my participation in the procedure from the performing physician.  

## 2021-07-13 ENCOUNTER — Telehealth: Payer: Self-pay

## 2021-07-13 NOTE — Telephone Encounter (Signed)
LVM

## 2021-07-14 ENCOUNTER — Ambulatory Visit
Admission: RE | Admit: 2021-07-14 | Discharge: 2021-07-14 | Disposition: A | Discharge status: Home / Self Care | Payer: Medicare Other | Source: Ambulatory Visit | Attending: Internal Medicine | Admitting: Internal Medicine

## 2021-07-14 ENCOUNTER — Other Ambulatory Visit: Payer: Self-pay

## 2021-07-14 DIAGNOSIS — Z1231 Encounter for screening mammogram for malignant neoplasm of breast: Secondary | ICD-10-CM

## 2021-07-26 ENCOUNTER — Encounter: Payer: Self-pay | Admitting: Gastroenterology

## 2022-03-24 IMAGING — MG DIGITAL SCREENING BILAT W/ TOMO W/ CAD
8 series · 9 of 24 positions shown · non-contrast
Comparison: Previous exam(s).

CLINICAL DATA: Screening.

EXAM:
DIGITAL SCREENING BILATERAL MAMMOGRAM WITH TOMO AND CAD

[R MLO synth-2D]
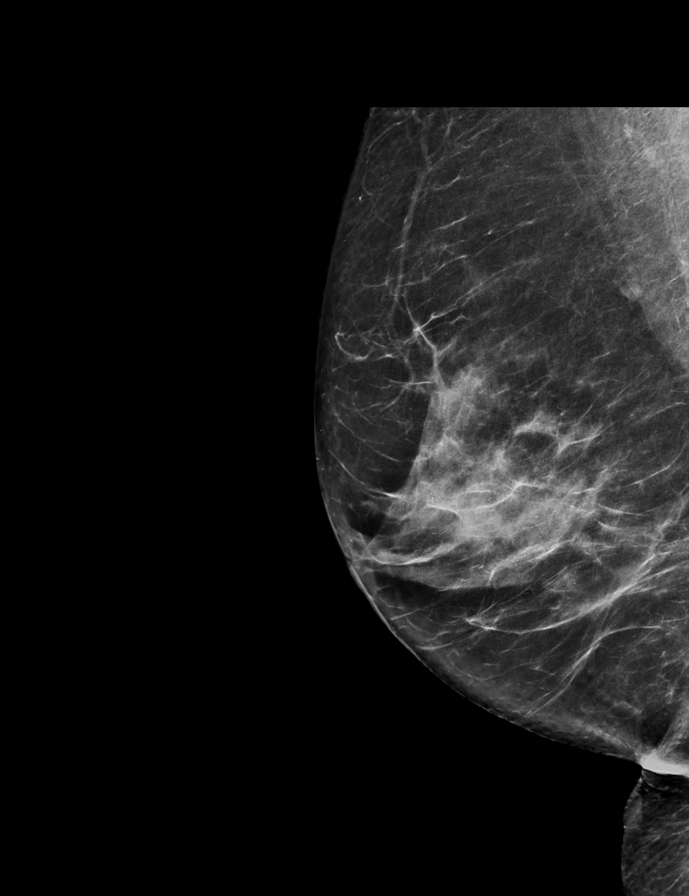

[L MLO synth-2D]
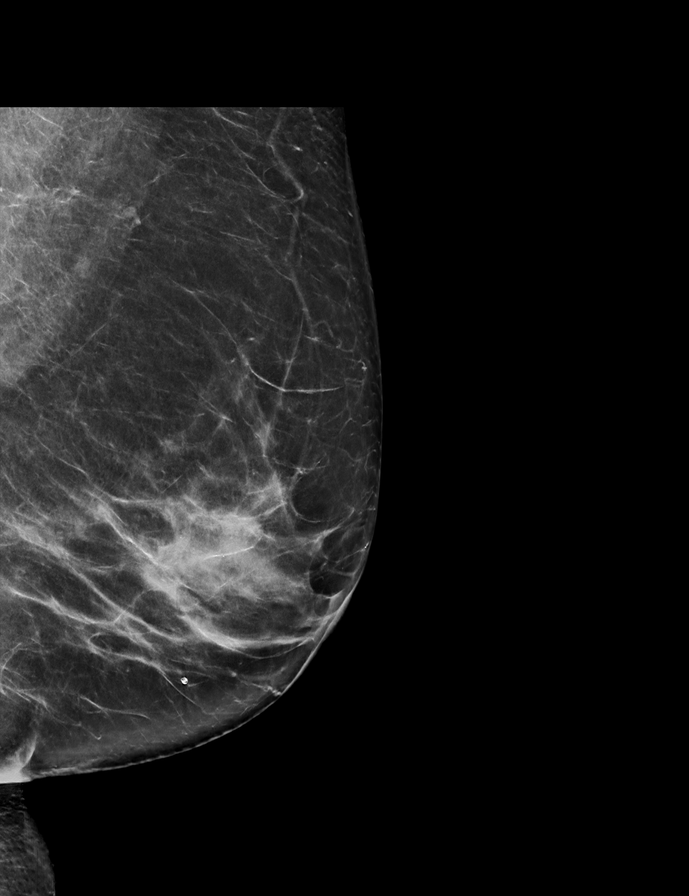

[R CC synth-2D]
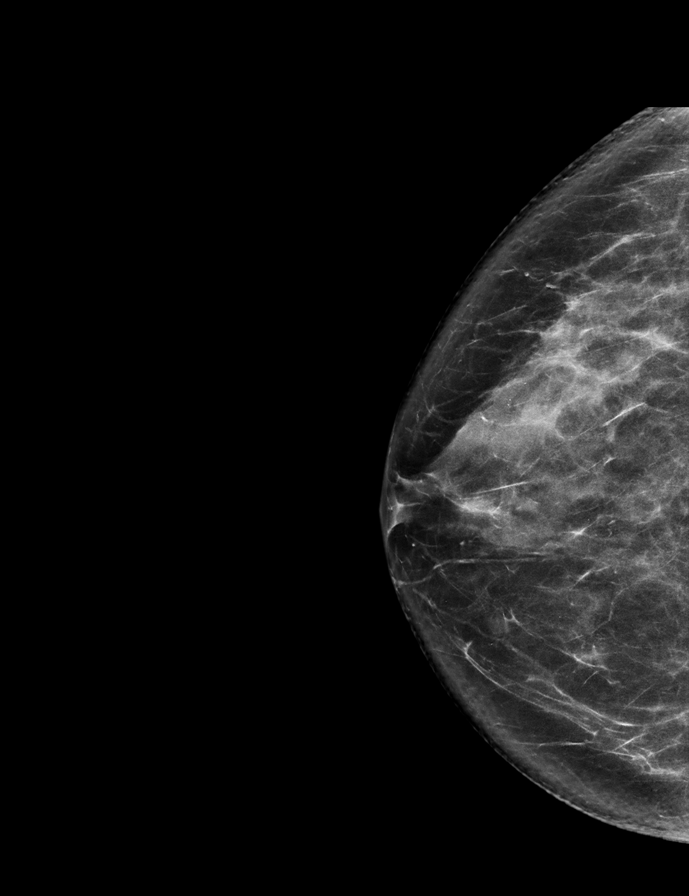

[L CC synth-2D]
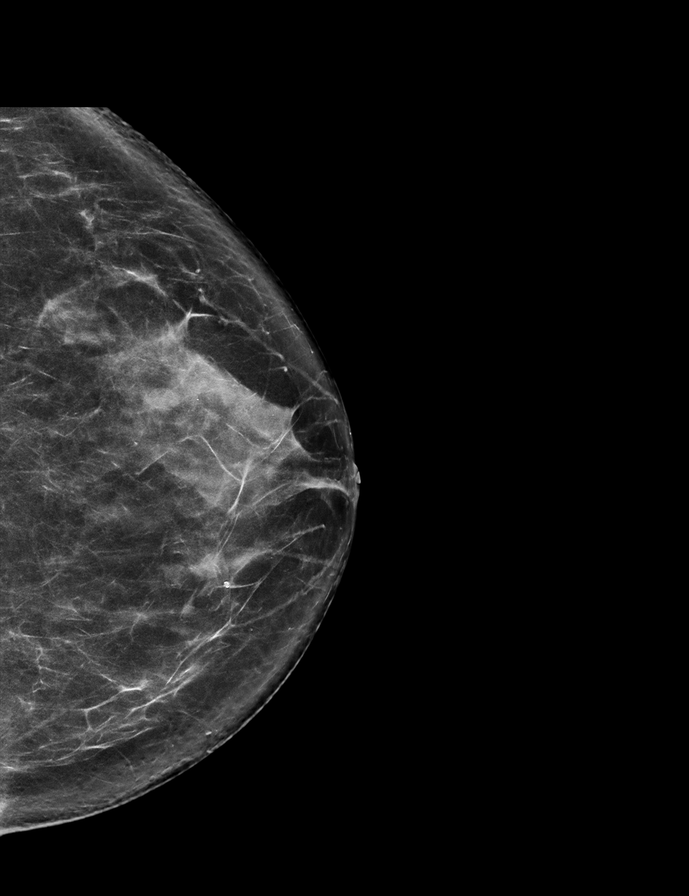

[L CC tomo · 2 of 75 frames shown]
[frame 25/75]
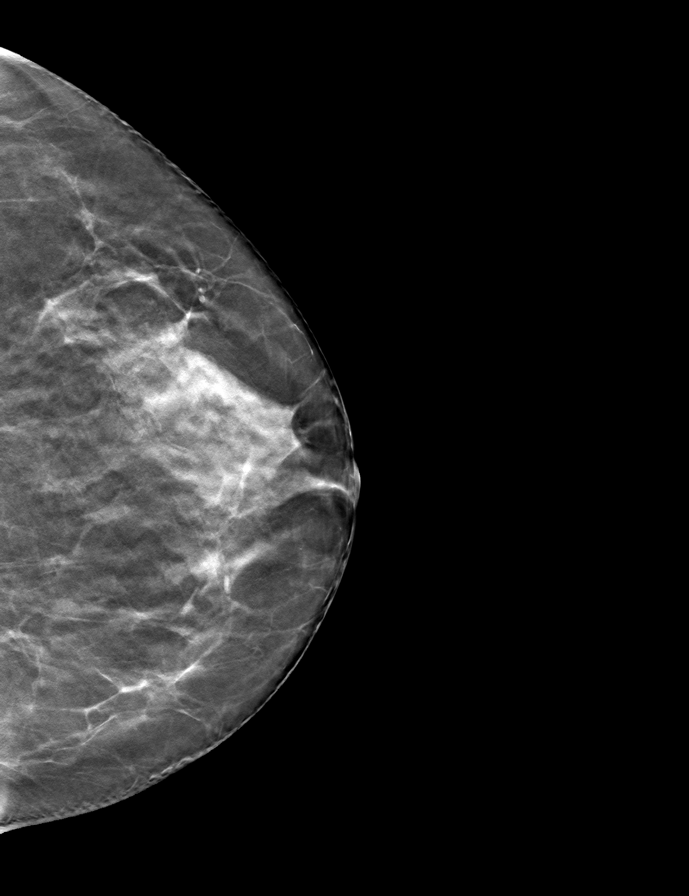
[frame 38/75]
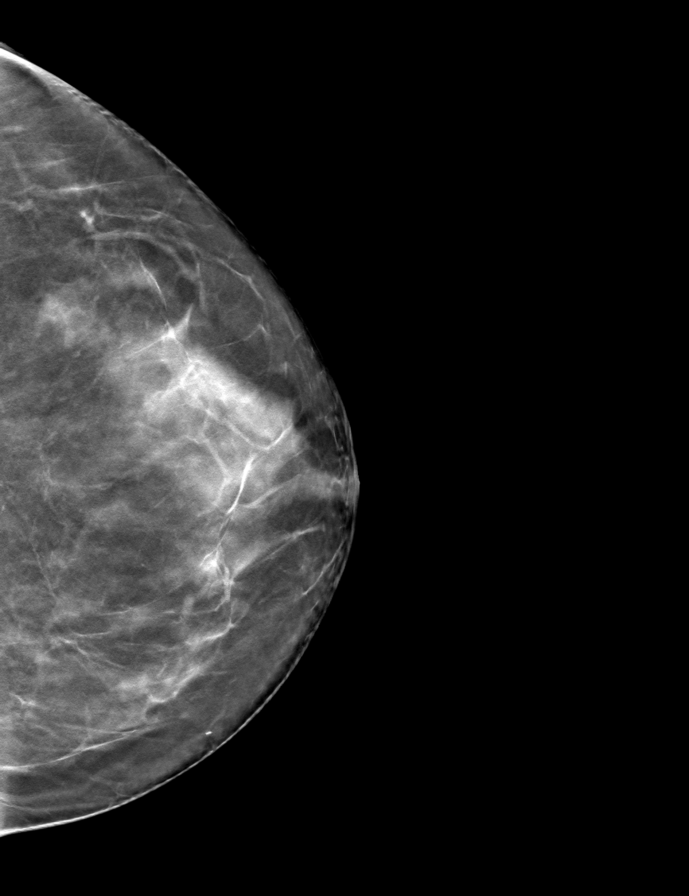

[R CC tomo · tomo slice 37/72.0]
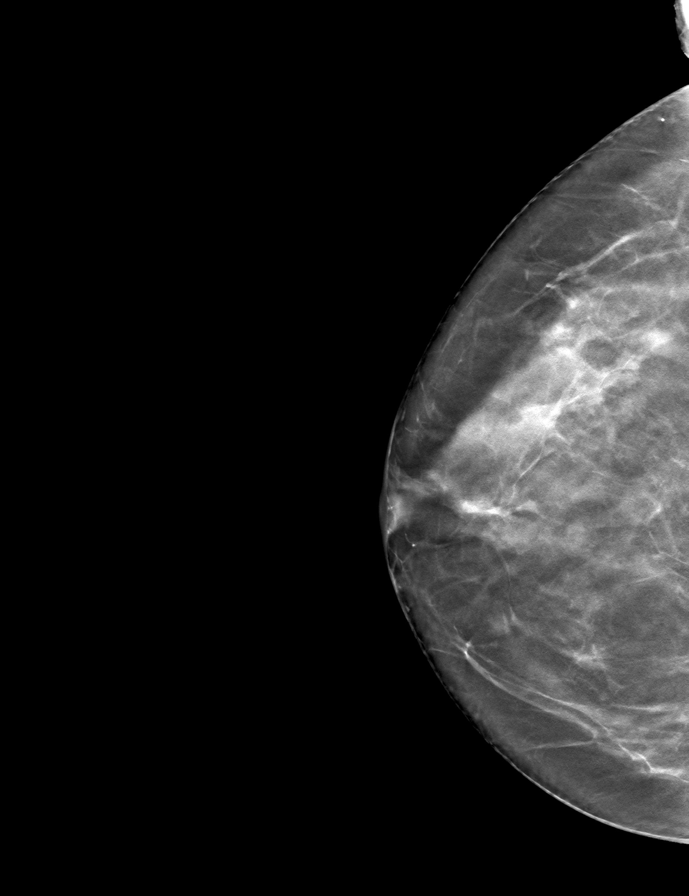

[L MLO tomo · tomo slice 39/76.0]
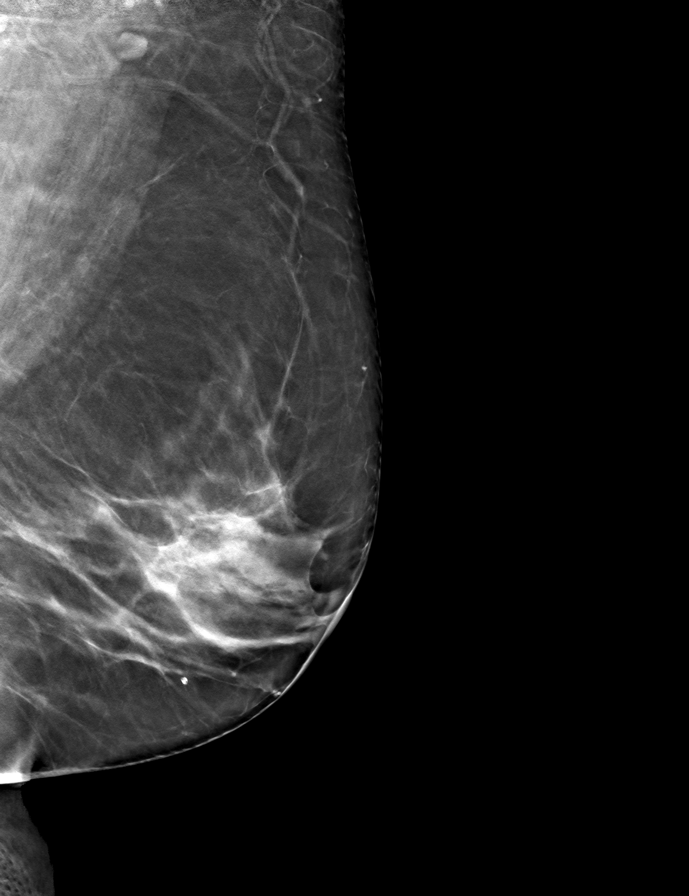

[R MLO tomo · tomo slice 37/74.0]
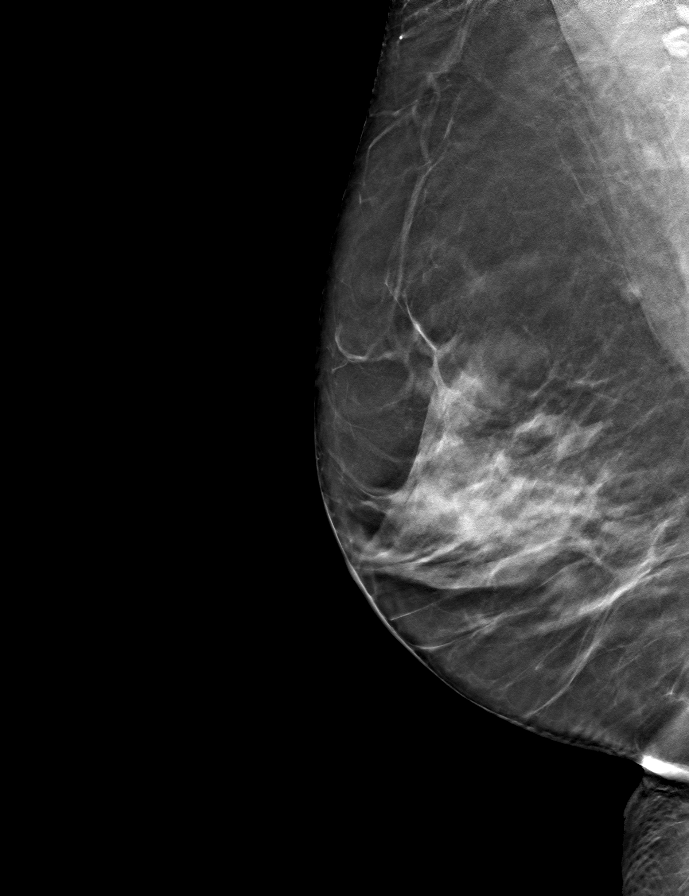

[9 of 24 positions shown; findings below may reference images not displayed]

ACR Breast Density Category c: The breast tissue is heterogeneously
dense, which may obscure small masses.
FINDINGS: There are no findings suspicious for malignancy. Images were
processed with CAD.
IMPRESSION: No mammographic evidence of malignancy. A result letter of this
screening mammogram will be mailed directly to the patient.

RECOMMENDATION:
Screening mammogram in one year. (Code:FT-U-LHB)

BI-RADS CATEGORY  1: Negative.

## 2022-04-19 ENCOUNTER — Other Ambulatory Visit: Payer: Self-pay | Admitting: Internal Medicine

## 2022-04-19 DIAGNOSIS — Z1231 Encounter for screening mammogram for malignant neoplasm of breast: Secondary | ICD-10-CM

## 2022-06-14 ENCOUNTER — Other Ambulatory Visit: Payer: Self-pay | Admitting: Internal Medicine

## 2022-06-14 DIAGNOSIS — M810 Age-related osteoporosis without current pathological fracture: Secondary | ICD-10-CM

## 2022-07-15 ENCOUNTER — Ambulatory Visit
Admission: RE | Admit: 2022-07-15 | Discharge: 2022-07-15 | Disposition: A | Discharge status: Home / Self Care | Payer: Medicare Other | Source: Ambulatory Visit | Attending: Internal Medicine | Admitting: Internal Medicine

## 2022-07-15 DIAGNOSIS — Z1231 Encounter for screening mammogram for malignant neoplasm of breast: Secondary | ICD-10-CM

## 2022-11-23 ENCOUNTER — Ambulatory Visit
Admission: RE | Admit: 2022-11-23 | Discharge: 2022-11-23 | Disposition: A | Discharge status: Home / Self Care | Payer: Medicare Other | Source: Ambulatory Visit | Attending: Internal Medicine | Admitting: Internal Medicine

## 2022-11-23 DIAGNOSIS — M810 Age-related osteoporosis without current pathological fracture: Secondary | ICD-10-CM

## 2023-06-07 ENCOUNTER — Other Ambulatory Visit: Payer: Self-pay | Admitting: Physician Assistant

## 2023-06-07 DIAGNOSIS — Z1231 Encounter for screening mammogram for malignant neoplasm of breast: Secondary | ICD-10-CM

## 2023-07-18 ENCOUNTER — Ambulatory Visit: Admission: RE | Admit: 2023-07-18 | Payer: Medicare Other | Source: Ambulatory Visit

## 2023-07-18 DIAGNOSIS — Z1231 Encounter for screening mammogram for malignant neoplasm of breast: Secondary | ICD-10-CM

## 2023-12-04 ENCOUNTER — Other Ambulatory Visit (HOSPITAL_COMMUNITY): Payer: Self-pay | Admitting: Physician Assistant

## 2023-12-04 DIAGNOSIS — E785 Hyperlipidemia, unspecified: Secondary | ICD-10-CM

## 2023-12-14 ENCOUNTER — Ambulatory Visit (HOSPITAL_COMMUNITY)
Admission: RE | Admit: 2023-12-14 | Discharge: 2023-12-14 | Disposition: A | Discharge status: Home / Self Care | Payer: Self-pay | Source: Ambulatory Visit | Attending: Physician Assistant | Admitting: Physician Assistant

## 2023-12-14 DIAGNOSIS — E785 Hyperlipidemia, unspecified: Secondary | ICD-10-CM | POA: Insufficient documentation

## 2024-04-09 ENCOUNTER — Other Ambulatory Visit: Payer: Self-pay | Admitting: Physician Assistant

## 2024-04-09 DIAGNOSIS — Z1231 Encounter for screening mammogram for malignant neoplasm of breast: Secondary | ICD-10-CM

## 2024-04-15 ENCOUNTER — Other Ambulatory Visit: Payer: Self-pay | Admitting: Physician Assistant

## 2024-04-15 DIAGNOSIS — M810 Age-related osteoporosis without current pathological fracture: Secondary | ICD-10-CM

## 2024-07-18 ENCOUNTER — Ambulatory Visit
Admission: RE | Admit: 2024-07-18 | Discharge: 2024-07-18 | Disposition: A | Discharge status: Home / Self Care | Source: Ambulatory Visit | Attending: Physician Assistant | Admitting: Physician Assistant

## 2024-07-18 DIAGNOSIS — Z1231 Encounter for screening mammogram for malignant neoplasm of breast: Secondary | ICD-10-CM

## 2024-07-23 ENCOUNTER — Other Ambulatory Visit: Payer: Self-pay | Admitting: Physician Assistant

## 2024-07-23 DIAGNOSIS — R928 Other abnormal and inconclusive findings on diagnostic imaging of breast: Secondary | ICD-10-CM

## 2024-07-29 ENCOUNTER — Ambulatory Visit
Admission: RE | Admit: 2024-07-29 | Discharge: 2024-07-29 | Disposition: A | Discharge status: Home / Self Care | Source: Ambulatory Visit | Attending: Physician Assistant

## 2024-07-29 DIAGNOSIS — R928 Other abnormal and inconclusive findings on diagnostic imaging of breast: Secondary | ICD-10-CM

## 2024-12-16 ENCOUNTER — Other Ambulatory Visit (HOSPITAL_BASED_OUTPATIENT_CLINIC_OR_DEPARTMENT_OTHER)

## 2024-12-17 ENCOUNTER — Inpatient Hospital Stay (HOSPITAL_BASED_OUTPATIENT_CLINIC_OR_DEPARTMENT_OTHER): Admission: RE | Admit: 2024-12-17 | Discharge: 2024-12-17 | Payer: Self-pay | Attending: Physician Assistant

## 2024-12-17 DIAGNOSIS — M810 Age-related osteoporosis without current pathological fracture: Secondary | ICD-10-CM | POA: Insufficient documentation

## 2024-12-30 ENCOUNTER — Other Ambulatory Visit (HOSPITAL_COMMUNITY): Payer: Self-pay | Admitting: Physician Assistant

## 2024-12-30 ENCOUNTER — Encounter: Payer: Self-pay | Admitting: Neurology

## 2024-12-30 DIAGNOSIS — G43109 Migraine with aura, not intractable, without status migrainosus: Secondary | ICD-10-CM

## 2025-01-03 ENCOUNTER — Other Ambulatory Visit

## 2025-01-10 ENCOUNTER — Ambulatory Visit (HOSPITAL_COMMUNITY)
Admission: RE | Admit: 2025-01-10 | Discharge: 2025-01-10 | Disposition: A | Source: Ambulatory Visit | Attending: Physician Assistant | Admitting: Physician Assistant

## 2025-01-10 DIAGNOSIS — G43109 Migraine with aura, not intractable, without status migrainosus: Secondary | ICD-10-CM | POA: Insufficient documentation

## 2025-04-10 ENCOUNTER — Ambulatory Visit: Payer: Self-pay | Admitting: Neurology
